# Patient Record
Sex: Male | Born: 1980 | Race: Black or African American | Hispanic: No | Marital: Single | Smoking: Never smoker
Health system: Southern US, Community
[De-identification: ages and names within clinical notes are randomized; demographics above are authoritative.]

## PROBLEM LIST (undated history)

## (undated) DIAGNOSIS — D696 Thrombocytopenia, unspecified: Principal | ICD-10-CM

## (undated) HISTORY — DX: Thrombocytopenia, unspecified: D69.6

---

## 2003-12-13 ENCOUNTER — Emergency Department (HOSPITAL_COMMUNITY): Admission: EM | Admit: 2003-12-13 | Discharge: 2003-12-14 | Payer: Self-pay | Admitting: Emergency Medicine

## 2005-01-04 ENCOUNTER — Emergency Department (HOSPITAL_COMMUNITY): Admission: EM | Admit: 2005-01-04 | Discharge: 2005-01-04 | Payer: Self-pay | Admitting: Emergency Medicine

## 2006-11-11 ENCOUNTER — Emergency Department (HOSPITAL_COMMUNITY): Admission: EM | Admit: 2006-11-11 | Discharge: 2006-11-12 | Payer: Self-pay | Admitting: Emergency Medicine

## 2007-12-02 ENCOUNTER — Emergency Department (HOSPITAL_COMMUNITY): Admission: EM | Admit: 2007-12-02 | Discharge: 2007-12-02 | Payer: Self-pay | Admitting: Family Medicine

## 2007-12-29 ENCOUNTER — Emergency Department (HOSPITAL_COMMUNITY): Admission: EM | Admit: 2007-12-29 | Discharge: 2007-12-29 | Payer: Self-pay | Admitting: Family Medicine

## 2008-01-26 IMAGING — CT CT EXTREM LOW W/O CM*R*
3 series · 16 of 33 positions shown, 19 images · IV contrast (agent unspecified)
Comparison: Plain radiographs 11/11/06.

CLINICAL DATA: Injury.  Dislocation.  
 CT OF THE RIGHT ANKLE WITHOUT CONTRAST:
TECHNIQUE: Multidetector CT imaging was performed according to the standard protocol.  No intravenous contrast was administered.  Multiplanar CT image reconstructions were also generated.

[Series 3: lowextremity 2.0 b60s · axial · 0.32mm/px · z∈[-1264,-1076]mm · 8 of 112 slices shown, 10 images]
[im 9/112  soft-tissue]
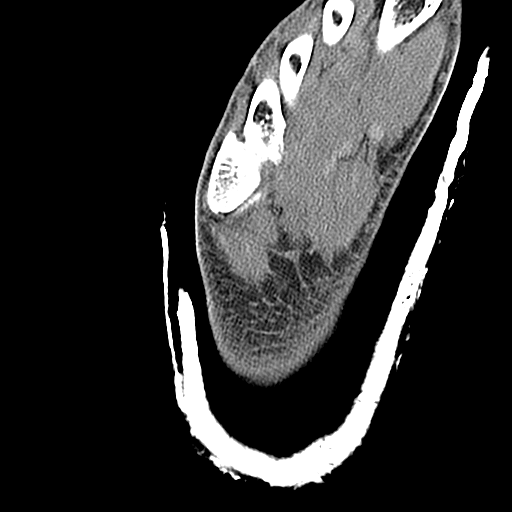
[im 9/112  bone]
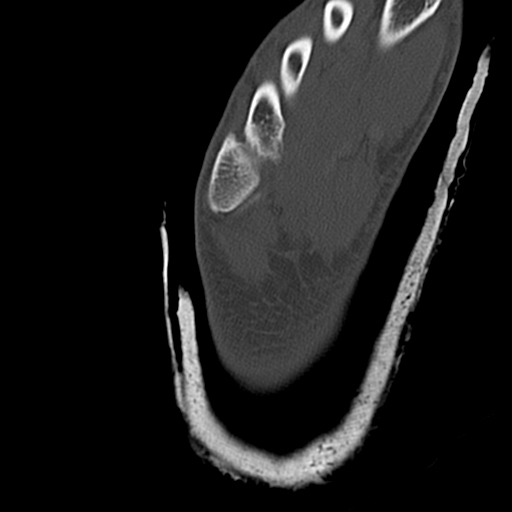
[im 26/112  bone]
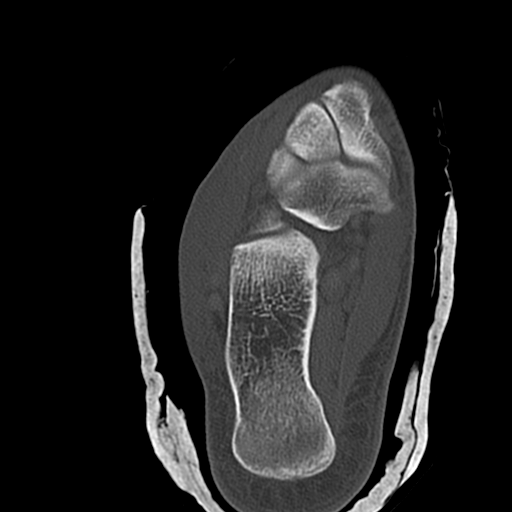
[im 35/112  bone]
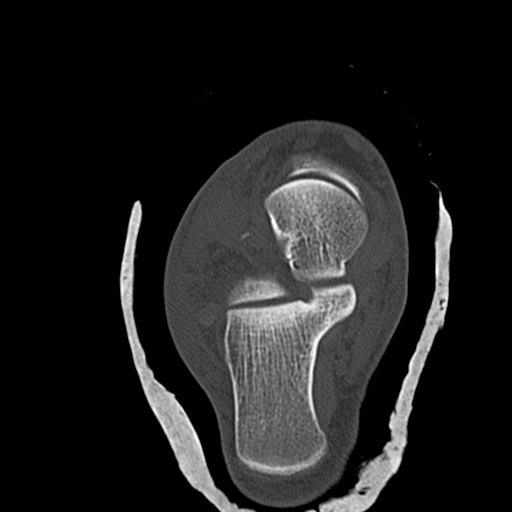
[im 52/112  bone]
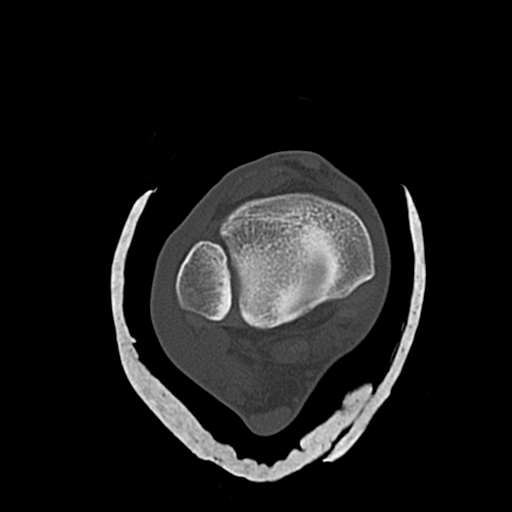
[im 60/112  soft-tissue]
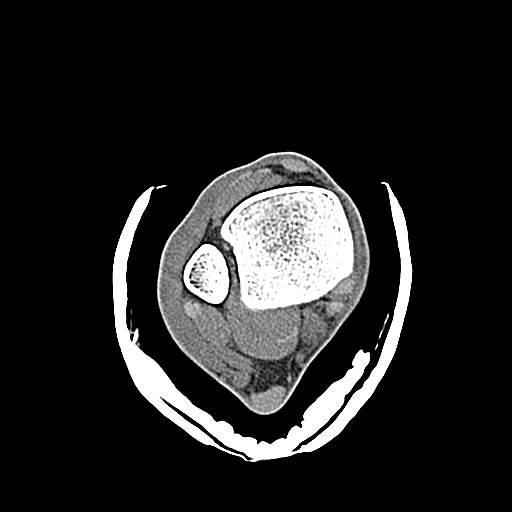
[im 60/112  bone]
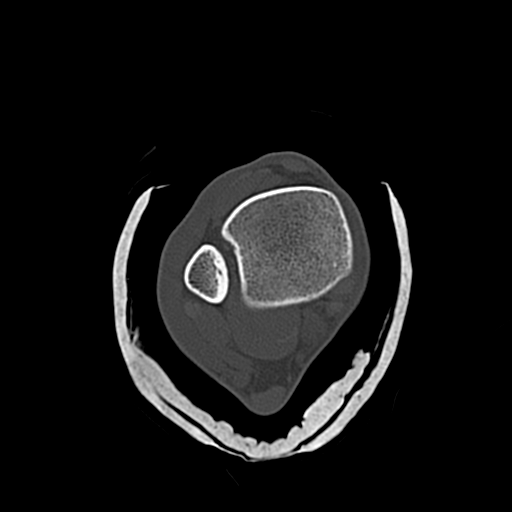
[im 77/112  bone]
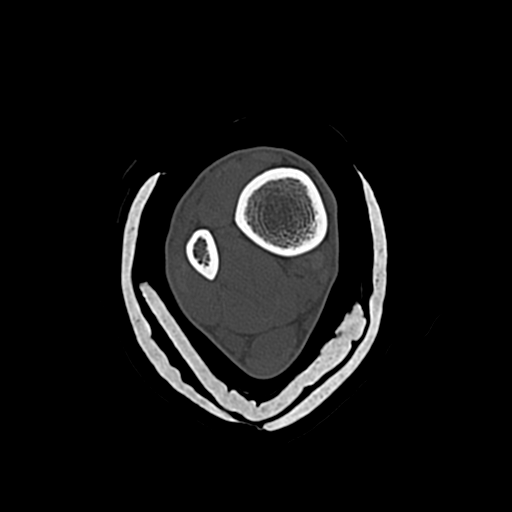
[im 86/112  bone]
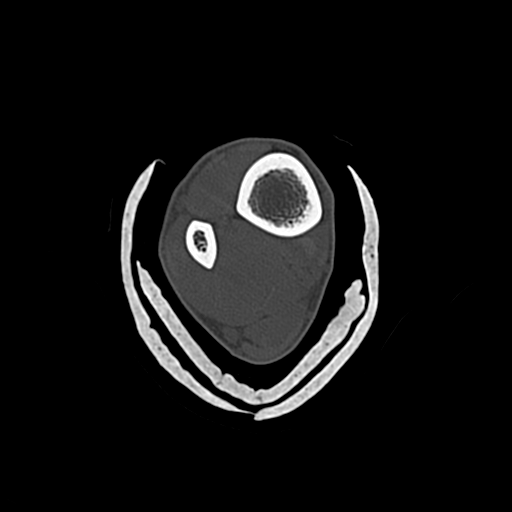
[im 103/112  bone]
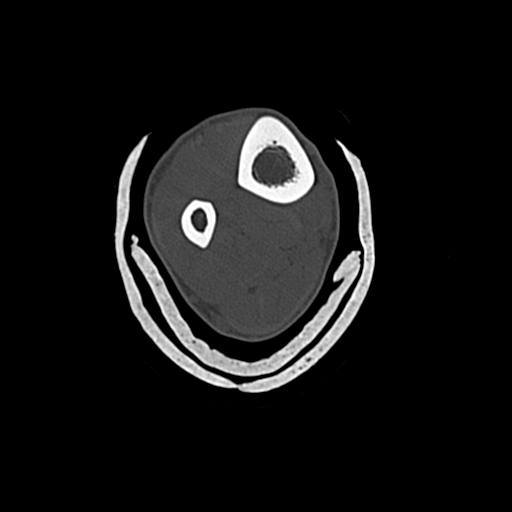

[Series 5: lowextremity 1.0 spo · coronal · 0.43mm/px · 3 of 232 slices shown (1 of 2)]
[im 47/232  bone]
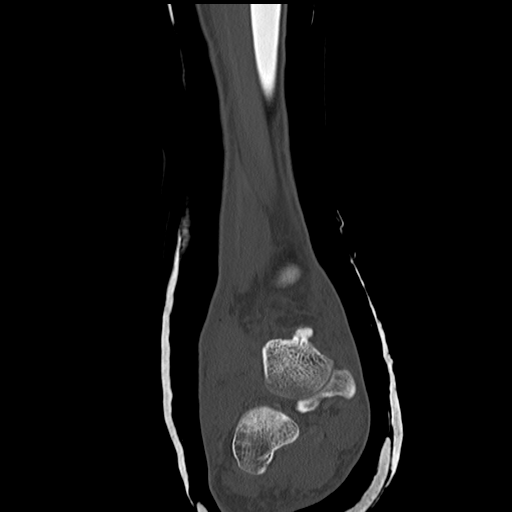
[im 93/232  bone]
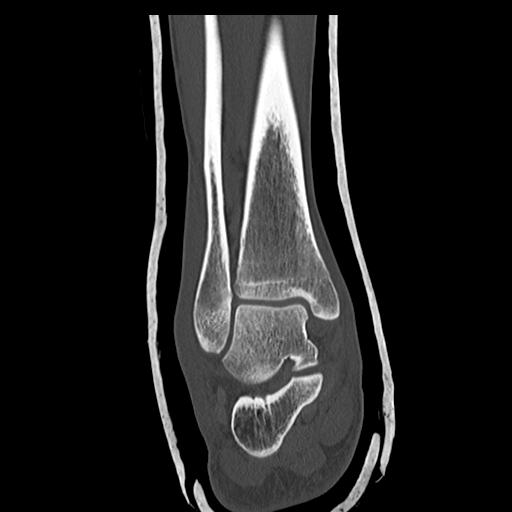
[im 139/232  bone]
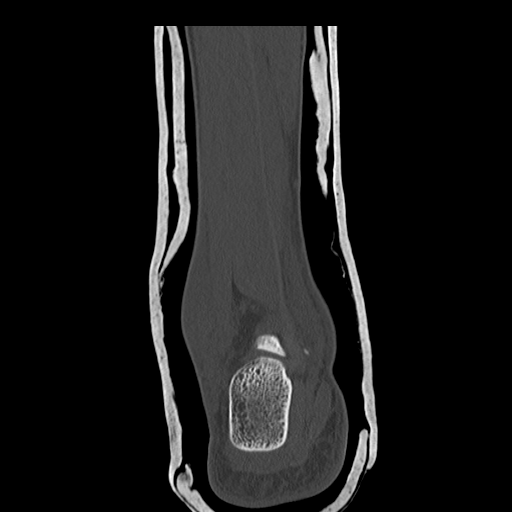

[Series 6: lowextremity 1.0 spo · sagittal · 0.74mm/px · 5 of 160 slices shown, 6 images (2 of 2)]
[im 54/160  bone]
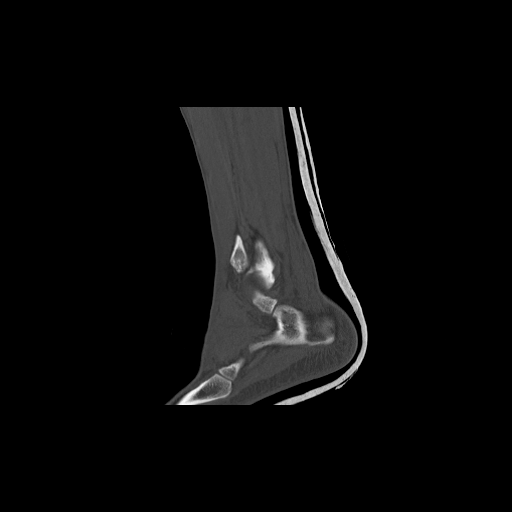
[im 67/160  bone]
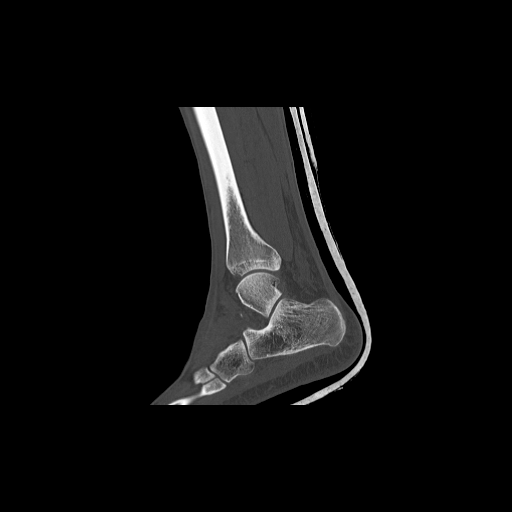
[im 80/160  soft-tissue]
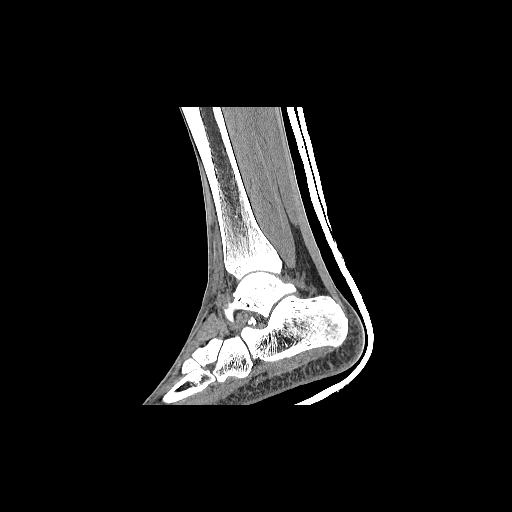
[im 80/160  bone]
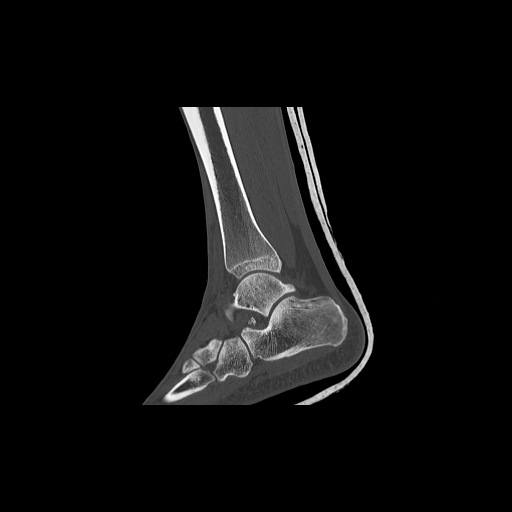
[im 93/160  bone]
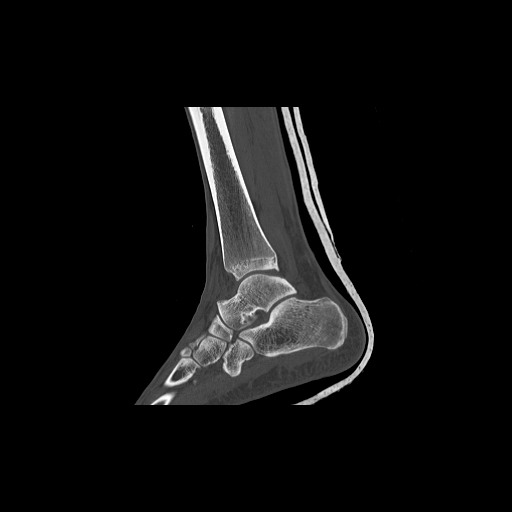
[im 107/160  bone]
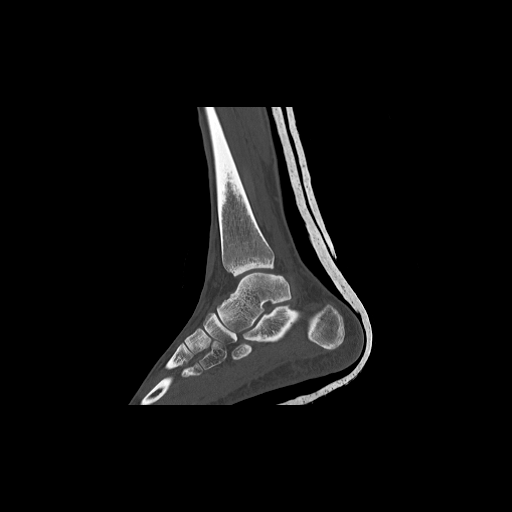

[16 of 33 positions shown; findings below may reference images not displayed]

FINDINGS: The tibiotalar and subtalar joints are located.  Imaged midfoot joints are also located.  There is marked soft tissue swelling about the ankle.  Two chip fractures are seen posteriorly off of the medial aspect of the joint likely originating from the medial talus.  Largest bony fragment is present within the sinus tarsi and likely originates from the anterior process of the calcaneus.  This fragment measures approximately 0.5 x 0.3 x 0.8 cm.  No definite tendon entrapment is identified although the flexor hallucis longus tendon passes immediately adjacent to a chip fracture off of the posterior aspect of the talus.  Tiny bony fragments are also identified at the anterior aspect of the talus slightly laterally.  Donor site is not clearly seen.  Tiny amount of gas within the subcutaneous soft tissues of the dorsum of the foot.  Ligaments are not well seen.
IMPRESSION: 1.  Successful relocation of hindfoot dislocation.
 2.  Chip fractures off of the posteromedial talus and likely off of the anterior process of the calcaneus.  
 3.  Negative for definite tendon entrapment although the FHL does pass adjacent to talus fracture raising the possibility for entrapment.

## 2009-01-29 ENCOUNTER — Emergency Department (HOSPITAL_COMMUNITY): Admission: EM | Admit: 2009-01-29 | Discharge: 2009-01-29 | Payer: Self-pay | Admitting: Family Medicine

## 2009-02-02 ENCOUNTER — Emergency Department (HOSPITAL_COMMUNITY): Admission: EM | Admit: 2009-02-02 | Discharge: 2009-02-02 | Payer: Self-pay | Admitting: Family Medicine

## 2009-05-25 ENCOUNTER — Emergency Department (HOSPITAL_COMMUNITY): Admission: EM | Admit: 2009-05-25 | Discharge: 2009-05-25 | Payer: Self-pay | Admitting: Family Medicine

## 2010-02-12 ENCOUNTER — Emergency Department (HOSPITAL_COMMUNITY): Admission: EM | Admit: 2010-02-12 | Discharge: 2010-02-12 | Payer: Self-pay | Admitting: Family Medicine

## 2010-11-30 ENCOUNTER — Emergency Department (HOSPITAL_COMMUNITY)
Admission: EM | Admit: 2010-11-30 | Discharge: 2010-11-30 | Disposition: A | Discharge status: Home / Self Care | Payer: No Typology Code available for payment source | Attending: Emergency Medicine | Admitting: Emergency Medicine

## 2010-11-30 DIAGNOSIS — S139XXA Sprain of joints and ligaments of unspecified parts of neck, initial encounter: Secondary | ICD-10-CM | POA: Insufficient documentation

## 2010-11-30 DIAGNOSIS — Y929 Unspecified place or not applicable: Secondary | ICD-10-CM | POA: Insufficient documentation

## 2010-11-30 DIAGNOSIS — R51 Headache: Secondary | ICD-10-CM | POA: Insufficient documentation

## 2010-11-30 DIAGNOSIS — M542 Cervicalgia: Secondary | ICD-10-CM | POA: Insufficient documentation

## 2011-01-07 LAB — CULTURE, ROUTINE-ABSCESS

## 2011-06-23 LAB — INFLUENZA A AND B ANTIGEN (CONVERTED LAB)

## 2011-09-15 ENCOUNTER — Encounter: Payer: Self-pay | Admitting: *Deleted

## 2011-09-15 ENCOUNTER — Emergency Department (HOSPITAL_COMMUNITY)
Admission: EM | Admit: 2011-09-15 | Discharge: 2011-09-15 | Disposition: A | Discharge status: Home / Self Care | Payer: Self-pay | Source: Home / Self Care | Attending: Emergency Medicine | Admitting: Emergency Medicine

## 2011-09-15 DIAGNOSIS — R51 Headache: Secondary | ICD-10-CM

## 2011-09-15 MED ORDER — ACETAMINOPHEN-CODEINE #3 300-30 MG PO TABS: 1.0000 | ORAL_TABLET | Freq: Four times a day (QID) | ORAL | Status: AC | PRN

## 2011-09-15 NOTE — ED Provider Notes (Signed)
History     CSN: [id number] Arrival date & time: 09/15/2011  3:25 PM   First MD Initiated Contact with Patient 09/15/11 1443      Chief Complaint  Patient presents with  . Headache    (Consider location/radiation/quality/duration/timing/severity/associated sxs/prior treatment) Patient is a 30 y.o. male presenting with headaches. The history is provided by the patient.  Headache The primary symptoms include headaches. Primary symptoms do not include loss of consciousness, altered mental status, dizziness, visual change, paresthesias, loss of sensation, speech change, memory loss, fever, nausea or vomiting. The symptoms began 12 to 24 hours ago. The symptoms are improving. The neurological symptoms are focal.  The headache is not associated with aura, photophobia, visual change, neck stiffness, paresthesias or weakness.  Additional symptoms include pain. Additional symptoms do not include neck stiffness, weakness, lower back pain, photophobia, aura, taste disturbance, tinnitus or vertigo. Medical issues do not include seizures.    History reviewed. No pertinent past medical history.  History reviewed. No pertinent past surgical history.  History reviewed. No pertinent family history.  History  Substance Use Topics  . Smoking status: Not on file  . Smokeless tobacco: Not on file  . Alcohol Use: Not on file      Review of Systems  Constitutional: Negative for fever.  HENT: Negative for neck stiffness and tinnitus.   Eyes: Negative for photophobia.  Gastrointestinal: Negative for nausea and vomiting.  Neurological: Positive for headaches. Negative for dizziness, vertigo, speech change, loss of consciousness, weakness and paresthesias.  Psychiatric/Behavioral: Negative for memory loss and altered mental status.    Allergies  Review of patient's allergies indicates no known allergies.  Home Medications   Current Outpatient Rx  Name Route Sig Dispense Refill  .  ACETAMINOPHEN-CODEINE #3 300-30 MG PO TABS Oral Take 1-2 tablets by mouth every 6 (six) hours as needed for pain. 15 tablet 0    BP 121/73  Pulse 50  Temp(Src) 98.6 F (37 C) (Oral)  Resp 14  SpO2 100%  Physical Exam  Nursing note and vitals reviewed. Constitutional: He is oriented to person, place, and time. He appears well-developed and well-nourished. No distress.  HENT:  Head: Normocephalic.  Mouth/Throat: Uvula is midline.  Eyes: Conjunctivae are normal. No scleral icterus.  Neck: Normal range of motion. No JVD present.  Cardiovascular: Normal rate and regular rhythm.   Pulmonary/Chest: Effort normal and breath sounds normal. He has no decreased breath sounds. He has no wheezes. He has no rhonchi. He has no rales.  Lymphadenopathy:    He has no cervical adenopathy.  Neurological: He is alert and oriented to person, place, and time. He has normal reflexes. No cranial nerve deficit. He exhibits normal muscle tone. Coordination normal.  Skin: Skin is warm. He is not diaphoretic.    ED Course  Procedures (including critical care time)  Labs Reviewed - No data to display No results found.   1. Headache       MDM  Headache- No further neurological symptoms-recently more visual efforts- suspect refractory visual. problems        Jimmie Molly, MD 09/15/11 [phone]

## 2011-09-15 NOTE — ED Notes (Signed)
Pt  Reports    l  Sided  Headache     Symptoms  Started  Today  denys  Any  Other      Issues  Such  As  Vomiting  /  Nausea     Or  Photophobia        Is  Awake   And  Alert    He  Was  Actually texting on phone  When  Arrived

## 2011-12-23 ENCOUNTER — Ambulatory Visit (INDEPENDENT_AMBULATORY_CARE_PROVIDER_SITE_OTHER): Payer: 59 | Admitting: Physician Assistant

## 2011-12-23 VITALS — BP 134/72 | HR 48 | Temp 98.9°F | Resp 16 | Ht 67.0 in | Wt 119.2 lb

## 2011-12-23 DIAGNOSIS — H00016 Hordeolum externum left eye, unspecified eyelid: Secondary | ICD-10-CM

## 2011-12-23 DIAGNOSIS — H00019 Hordeolum externum unspecified eye, unspecified eyelid: Secondary | ICD-10-CM

## 2011-12-23 MED ORDER — CEPHALEXIN 500 MG PO CAPS: 500.0000 mg | ORAL_CAPSULE | Freq: Three times a day (TID) | ORAL | Status: AC

## 2011-12-23 NOTE — Progress Notes (Signed)
  Subjective:    Patient ID: Jeremy Cervantes, male    DOB: July 31, 1981, 31 y.o.   MRN: [id number]  HPI 2 day h/o L eye feeling aggravated and swollen.  Painful.  NKI.  No FB. No drainage.  Review of Systems  All other systems reviewed and are negative.        Objective:   Physical Exam  Constitutional: He appears well-developed and well-nourished.  HENT:  Head: Normocephalic and atraumatic.  Eyes: Conjunctivae and EOM are normal. Pupils are equal, round, and reactive to light. Right eye exhibits no discharge. Left eye exhibits no discharge.       L upper lid w/ mild edema and central hordeolum.   Neck: Normal range of motion. Neck supple.  Cardiovascular: Normal rate, regular rhythm and normal heart sounds.        Physiologic bradycardia  Pulmonary/Chest: Effort normal and breath sounds normal.          Assessment & Plan:  Hordeolum

## 2011-12-23 NOTE — Patient Instructions (Signed)
See stye h/o that was given.  OOW X 2 days.

## 2013-12-16 ENCOUNTER — Other Ambulatory Visit: Payer: Self-pay | Admitting: Occupational Medicine

## 2013-12-16 ENCOUNTER — Ambulatory Visit: Payer: Self-pay

## 2013-12-16 DIAGNOSIS — R52 Pain, unspecified: Secondary | ICD-10-CM

## 2014-07-14 ENCOUNTER — Telehealth: Payer: Self-pay | Admitting: Hematology

## 2014-07-14 NOTE — Telephone Encounter (Signed)
S/W PATIENT AND GAVE NP APPT FOR 11/10 @ W/DR. SEH[facility] REFERRING DR. Donette LarryHUSAIN DX- LOW PLTS BETA TUBLIN

## 2014-07-21 ENCOUNTER — Telehealth: Payer: Self-pay

## 2014-07-21 NOTE — Telephone Encounter (Signed)
Delivered chart-07/21/14 °

## 2014-08-04 ENCOUNTER — Other Ambulatory Visit: Payer: Self-pay | Admitting: *Deleted

## 2014-08-07 ENCOUNTER — Telehealth: Payer: Self-pay | Admitting: Hematology and Oncology

## 2014-08-07 NOTE — Telephone Encounter (Signed)
, °

## 2014-08-08 ENCOUNTER — Ambulatory Visit: Payer: Self-pay

## 2014-08-17 ENCOUNTER — Ambulatory Visit: Payer: Self-pay

## 2014-08-17 ENCOUNTER — Ambulatory Visit: Payer: Self-pay | Admitting: Hematology and Oncology

## 2014-08-18 ENCOUNTER — Encounter: Payer: Self-pay | Admitting: Hematology and Oncology

## 2014-08-18 ENCOUNTER — Ambulatory Visit: Payer: 59

## 2014-08-18 ENCOUNTER — Telehealth: Payer: Self-pay | Admitting: Hematology and Oncology

## 2014-08-18 ENCOUNTER — Ambulatory Visit (HOSPITAL_BASED_OUTPATIENT_CLINIC_OR_DEPARTMENT_OTHER): Payer: 59

## 2014-08-18 ENCOUNTER — Ambulatory Visit (HOSPITAL_BASED_OUTPATIENT_CLINIC_OR_DEPARTMENT_OTHER): Payer: 59 | Admitting: Hematology and Oncology

## 2014-08-18 VITALS — BP 114/54 | HR 57 | Temp 97.7°F | Resp 18 | Ht 66.0 in | Wt 121.8 lb

## 2014-08-18 DIAGNOSIS — D696 Thrombocytopenia, unspecified: Secondary | ICD-10-CM

## 2014-08-18 HISTORY — DX: Thrombocytopenia, unspecified: D69.6

## 2014-08-18 LAB — CBC WITH DIFFERENTIAL/PLATELET
BASO%: 0.2 % (ref 0.0–2.0)
BASOS ABS: 0 10*3/uL (ref 0.0–0.1)
EOS ABS: 0 10*3/uL (ref 0.0–0.5)
EOS%: 0.8 % (ref 0.0–7.0)
HCT: 42.1 % (ref 38.4–49.9)
HGB: 13.8 g/dL (ref 13.0–17.1)
LYMPH#: 1.5 10*3/uL (ref 0.9–3.3)
LYMPH%: 27.4 % (ref 14.0–49.0)
MCH: 30.4 pg (ref 27.2–33.4)
MCHC: 32.8 g/dL (ref 32.0–36.0)
MCV: 92.7 fL (ref 79.3–98.0)
MONO#: 0.4 10*3/uL (ref 0.1–0.9)
MONO%: 8.1 % (ref 0.0–14.0)
NEUT%: 63.5 % (ref 39.0–75.0)
NEUTROS ABS: 3.4 10*3/uL (ref 1.5–6.5)
RBC: 4.54 10*6/uL (ref 4.20–5.82)
RDW: 12.5 % (ref 11.0–14.6)
WBC: 5.3 10*3/uL (ref 4.0–10.3)

## 2014-08-18 LAB — MORPHOLOGY
PLT EST: DECREASED
RBC Comments: NORMAL

## 2014-08-18 NOTE — Progress Notes (Signed)
Checked in new pt with no financial concerns at this time.  Pt is here for a hematology concern so financial assistance may not be needed but he has my card for any questions or concerns.

## 2014-08-18 NOTE — Telephone Encounter (Signed)
Gave avs. No return. °

## 2014-08-18 NOTE — Telephone Encounter (Signed)
Gave avs & cal for May 2016. °

## 2014-08-19 LAB — SEDIMENTATION RATE: SED RATE: 1 mm/h (ref 0–16)

## 2014-08-19 NOTE — Progress Notes (Signed)
[facility] CONSULT NOTE  Patient Care Team: Georgann HousekeeperKarrar [name], MD as PCP - General (Internal Medicine) Georgann HousekeeperKarrar [name], MD as Consulting Physician (Internal Medicine) Aasim Nelva BushShaheen Sehbai, MD as Consulting Physician (Hematology)  CHIEF COMPLAINTS/PURPOSE OF CONSULTATION:  thrombocytopenia  HISTORY OF PRESENTING ILLNESS:  Jeremy Cervantes 33 y.o. male is here because of thrombocytopenia.  He was found to have abnormal CBC from routine blood draw from PCP. Test drawn in October 2015 was low at 104. He denies recent bruising/bleeding, such as spontaneous epistaxis, hematuria, melena or hematochezia The patient denies history of liver disease, exposure to heparin, history of cardiac murmur/prior cardiovascular surgery or recent new medications He denies prior blood or platelet transfusions   MEDICAL HISTORY:  Past Medical History  Diagnosis Date  . Thrombocytopenia 08/18/2014    SURGICAL HISTORY: History reviewed. No pertinent past surgical history.  SOCIAL HISTORY: History   Social History  . Marital Status: Single    Spouse Name: N/A    Number of Children: N/A  . Years of Education: N/A   Occupational History  . Not on file.   Social History Main Topics  . Smoking status: Never Smoker   . Smokeless tobacco: Never Used  . Alcohol Use: No  . Drug Use: No  . Sexual Activity: Not on file   Other Topics Concern  . Not on file   Social History Narrative    FAMILY HISTORY: Family History  Problem Relation Age of Onset  . Cancer Maternal Grandfather     colon ca    ALLERGIES:  has No Known Allergies.  MEDICATIONS:  Current Outpatient Prescriptions  Medication Sig Dispense Refill  . doxycycline (VIBRA-TABS) 100 MG tablet   0  . valACYclovir (VALTREX) 500 MG tablet   3   No current facility-administered medications for this visit.    REVIEW OF SYSTEMS:   Constitutional: Denies fevers, chills or abnormal night sweats Eyes: Denies blurriness of  vision, double vision or watery eyes Ears, nose, mouth, throat, and face: Denies mucositis or sore throat Respiratory: Denies cough, dyspnea or wheezes Cardiovascular: Denies palpitation, chest discomfort or lower extremity swelling Gastrointestinal:  Denies nausea, heartburn or change in bowel habits Skin: Denies abnormal skin rashes Lymphatics: Denies new lymphadenopathy or easy bruising Neurological:Denies numbness, tingling or new weaknesses Behavioral/Psych: Mood is stable, no new changes  All other systems were reviewed with the patient and are negative.  PHYSICAL EXAMINATION: ECOG PERFORMANCE STATUS: 0 - Asymptomatic  Filed Vitals:   08/18/14 1035  BP: 114/54  Pulse: 57  Temp: 97.7 F (36.5 C)  Resp: 18   Filed Weights   08/18/14 1035  Weight: 121 lb 12.8 oz (55.248 kg)    GENERAL:alert, no distress and comfortable SKIN: skin color, texture, turgor are normal, no rashes or significant lesions EYES: normal, conjunctiva are pink and non-injected, sclera clear OROPHARYNX:no exudate, no erythema and lips, buccal mucosa, and tongue normal  NECK: supple, thyroid normal size, non-tender, without nodularity LYMPH:  no palpable lymphadenopathy in the cervical, axillary or inguinal LUNGS: clear to auscultation and percussion with normal breathing effort HEART: regular rate & rhythm and no murmurs and no lower extremity edema ABDOMEN:abdomen soft, non-tender and normal bowel sounds Musculoskeletal:no cyanosis of digits and no clubbing  PSYCH: alert & oriented x 3 with fluent speech NEURO: no focal motor/sensory deficits  LABORATORY DATA:  I have reviewed the data as listed Recent Results (from the past 2160 hour(s))  CBC with Differential     Status: Abnormal  Collection Time: 08/18/14 11:30 AM  Result Value Ref Range   WBC 5.3 4.0 - 10.3 10e3/uL   NEUT# 3.4 1.5 - 6.5 10e3/uL   HGB 13.8 13.0 - 17.1 g/dL   HCT 11.942.1 14.738.4 - 82.949.9 %   Platelets 108 Platelet count consistent  in citrate (L) 140 - 400 10e3/uL   MCV 92.7 79.3 - 98.0 fL   MCH 30.4 27.2 - 33.4 pg   MCHC 32.8 32.0 - 36.0 g/dL   RBC 5.624.54 1.304.20 - 8.655.82 10e6/uL   RDW 12.5 11.0 - 14.6 %   lymph# 1.5 0.9 - 3.3 10e3/uL   MONO# 0.4 0.1 - 0.9 10e3/uL   Eosinophils Absolute 0.0 0.0 - 0.5 10e3/uL   Basophils Absolute 0.0 0.0 - 0.1 10e3/uL   NEUT% 63.5 39.0 - 75.0 %   LYMPH% 27.4 14.0 - 49.0 %   MONO% 8.1 0.0 - 14.0 %   EOS% 0.8 0.0 - 7.0 %   BASO% 0.2 0.0 - 2.0 %  Morphology     Status: None   Collection Time: 08/18/14 11:30 AM  Result Value Ref Range   RBC Comments Within Normal Limits Within Normal Limits   White Cell Comments Rare Variant Lymph    PLT EST Decreased Adequate   Platelet Morphology Large Platelets Within Normal Limits  Sedimentation rate     Status: None   Collection Time: 08/18/14 11:31 AM  Result Value Ref Range   Sed Rate 1 0 - 16 mm/hr   ASSESSMENT & PLAN 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 Will order additional work-up in his next visit.

## 2014-08-19 NOTE — Assessment & Plan Note (Signed)
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 Will order additional work-up in his next visit.

## 2015-01-19 ENCOUNTER — Encounter (HOSPITAL_COMMUNITY): Payer: Self-pay | Admitting: Emergency Medicine

## 2015-01-19 ENCOUNTER — Emergency Department (INDEPENDENT_AMBULATORY_CARE_PROVIDER_SITE_OTHER)
Admission: EM | Admit: 2015-01-19 | Discharge: 2015-01-19 | Disposition: A | Discharge status: Home / Self Care | Payer: Self-pay | Source: Home / Self Care | Attending: Family Medicine | Admitting: Family Medicine

## 2015-01-19 DIAGNOSIS — G43109 Migraine with aura, not intractable, without status migrainosus: Secondary | ICD-10-CM

## 2015-01-19 MED ORDER — DICLOFENAC SODIUM 75 MG PO TBEC: 75.0000 mg | DELAYED_RELEASE_TABLET | Freq: Two times a day (BID) | ORAL | Status: DC | PRN

## 2015-01-19 NOTE — Discharge Instructions (Signed)
Thank you for coming in today. If you get worse go to the ER.  Go to the emergency room if your headache becomes excruciating or you have weakness or numbness or uncontrolled vomiting.    Migraine Headache A migraine headache is an intense, throbbing pain on one or both sides of your head. A migraine can last for 30 minutes to several hours. CAUSES  The exact cause of a migraine headache is not always known. However, a migraine may be caused when nerves in the brain become irritated and release chemicals that cause inflammation. This causes pain. Certain things may also trigger migraines, such as:  Alcohol.  Smoking.  Stress.  Menstruation.  Aged cheeses.  Foods or drinks that contain nitrates, glutamate, aspartame, or tyramine.  Lack of sleep.  Chocolate.  Caffeine.  Hunger.  Physical exertion.  Fatigue.  Medicines used to treat chest pain (nitroglycerine), birth control pills, estrogen, and some blood pressure medicines. SIGNS AND SYMPTOMS  Pain on one or both sides of your head.  Pulsating or throbbing pain.  Severe pain that prevents daily activities.  Pain that is aggravated by any physical activity.  Nausea, vomiting, or both.  Dizziness.  Pain with exposure to bright lights, loud noises, or activity.  General sensitivity to bright lights, loud noises, or smells. Before you get a migraine, you may get warning signs that a migraine is coming (aura). An aura may include:  Seeing flashing lights.  Seeing bright spots, halos, or zigzag lines.  Having tunnel vision or blurred vision.  Having feelings of numbness or tingling.  Having trouble talking.  Having muscle weakness. DIAGNOSIS  A migraine headache is often diagnosed based on:  Symptoms.  Physical exam.  A CT scan or MRI of your head. These imaging tests cannot diagnose migraines, but they can help rule out other causes of headaches. TREATMENT Medicines may be given for pain and  nausea. Medicines can also be given to help prevent recurrent migraines.  HOME CARE INSTRUCTIONS  Only take over-the-counter or prescription medicines for pain or discomfort as directed by your health care provider. The use of long-term narcotics is not recommended.  Lie down in a dark, quiet room when you have a migraine.  Keep a journal to find out what may trigger your migraine headaches. For example, write down:  What you eat and drink.  How much sleep you get.  Any change to your diet or medicines.  Limit alcohol consumption.  Quit smoking if you smoke.  Get 7-9 hours of sleep, or as recommended by your health care provider.  Limit stress.  Keep lights dim if bright lights bother you and make your migraines worse. SEEK IMMEDIATE MEDICAL CARE IF:   Your migraine becomes severe.  You have a fever.  You have a stiff neck.  You have vision loss.  You have muscular weakness or loss of muscle control.  You start losing your balance or have trouble walking.  You feel faint or pass out.  You have severe symptoms that are different from your first symptoms. MAKE SURE YOU:   Understand these instructions.  Will watch your condition.  Will get help right away if you are not doing well or get worse. Document Released: 09/15/2005 Document Revised: 01/30/2014 Document Reviewed: 05/23/2013 [facility] Patient Information 2015 [place], [place]. This information is not intended to replace advice given to you by your health care provider. Make sure you discuss any questions you have with your health care provider.  Concussion A  concussion, or closed-head injury, is a brain injury caused by a direct blow to the head or by a quick and sudden movement (jolt) of the head or neck. Concussions are usually not life-threatening. Even so, the effects of a concussion can be serious. If you have had a concussion before, you are more likely to experience concussion-like symptoms after a direct  blow to the head.  CAUSES  Direct blow to the head, such as from running into another player during a soccer game, being hit in a fight, or hitting your head on a hard surface.  A jolt of the head or neck that causes the brain to move back and forth inside the skull, such as in a car crash. SIGNS AND SYMPTOMS The signs of a concussion can be hard to notice. Early on, they may be missed by you, family members, and health care providers. You may look fine but act or feel differently. Symptoms are usually temporary, but they may last for days, weeks, or even longer. Some symptoms may appear right away while others may not show up for hours or days. Every head injury is different. Symptoms include:  Mild to moderate headaches that will not go away.  A feeling of pressure inside your head.  Having more trouble than usual:  Learning or remembering things you have heard.  Answering questions.  Paying attention or concentrating.  Organizing daily tasks.  Making decisions and solving problems.  Slowness in thinking, acting or reacting, speaking, or reading.  Getting lost or being easily confused.  Feeling tired all the time or lacking energy (fatigued).  Feeling drowsy.  Sleep disturbances.  Sleeping more than usual.  Sleeping less than usual.  Trouble falling asleep.  Trouble sleeping (insomnia).  Loss of balance or feeling lightheaded or dizzy.  Nausea or vomiting.  Numbness or tingling.  Increased sensitivity to:  Sounds.  Lights.  Distractions.  Vision problems or eyes that tire easily.  Diminished sense of taste or smell.  Ringing in the ears.  Mood changes such as feeling sad or anxious.  Becoming easily irritated or angry for little or no reason.  Lack of motivation.  Seeing or hearing things other people do not see or hear (hallucinations). DIAGNOSIS Your health care provider can usually diagnose a concussion based on a description of your injury  and symptoms. He or she will ask whether you passed out (lost consciousness) and whether you are having trouble remembering events that happened right before and during your injury. Your evaluation might include:  A brain scan to look for signs of injury to the brain. Even if the test shows no injury, you may still have a concussion.  Blood tests to be sure other problems are not present. TREATMENT  Concussions are usually treated in an emergency department, in urgent care, or at a clinic. You may need to stay in the hospital overnight for further treatment.  Tell your health care provider if you are taking any medicines, including prescription medicines, over-the-counter medicines, and natural remedies. Some medicines, such as blood thinners (anticoagulants) and aspirin, may increase the chance of complications. Also tell your health care provider whether you have had alcohol or are taking illegal drugs. This information may affect treatment.  Your health care provider will send you home with important instructions to follow.  How fast you will recover from a concussion depends on many factors. These factors include how severe your concussion is, what part of your brain was injured, your age,  and how healthy you were before the concussion.  Most people with mild injuries recover fully. Recovery can take time. In general, recovery is slower in older persons. Also, persons who have had a concussion in the past or have other medical problems may find that it takes longer to recover from their current injury. HOME CARE INSTRUCTIONS General Instructions  Carefully follow the directions your health care provider gave you.  Only take over-the-counter or prescription medicines for pain, discomfort, or fever as directed by your health care provider.  Take only those medicines that your health care provider has approved.  Do not drink alcohol until your health care provider says you are well enough  to do so. Alcohol and certain other drugs may slow your recovery and can put you at risk of further injury.  If it is harder than usual to remember things, write them down.  If you are easily distracted, try to do one thing at a time. For example, do not try to watch TV while fixing dinner.  Talk with family members or close friends when making important decisions.  Keep all follow-up appointments. Repeated evaluation of your symptoms is recommended for your recovery.  Watch your symptoms and tell others to do the same. Complications sometimes occur after a concussion. Older adults with a brain injury may have a higher risk of serious complications, such as a blood clot on the brain.  Tell your teachers, school nurse, school counselor, coach, athletic trainer, or work Production designer, theatre/television/film about your injury, symptoms, and restrictions. Tell them about what you can or cannot do. They should watch for:  Increased problems with attention or concentration.  Increased difficulty remembering or learning new information.  Increased time needed to complete tasks or assignments.  Increased irritability or decreased ability to cope with stress.  Increased symptoms.  Rest. Rest helps the brain to heal. Make sure you:  Get plenty of sleep at night. Avoid staying up late at night.  Keep the same bedtime hours on weekends and weekdays.  Rest during the day. Take daytime naps or rest breaks when you feel tired.  Limit activities that require a lot of thought or concentration. These include:  Doing homework or job-related work.  Watching TV.  Working on the computer.  Avoid any situation where there is potential for another head injury (football, hockey, soccer, basketball, martial arts, downhill snow sports and horseback riding). Your condition will get worse every time you experience a concussion. You should avoid these activities until you are evaluated by the appropriate follow-up health care  providers. Returning To Your Regular Activities You will need to return to your normal activities slowly, not all at once. You must give your body and brain enough time for recovery.  Do not return to sports or other athletic activities until your health care provider tells you it is safe to do so.  Ask your health care provider when you can drive, ride a bicycle, or operate heavy machinery. Your ability to react may be slower after a brain injury. Never do these activities if you are dizzy.  Ask your health care provider about when you can return to work or school. Preventing Another Concussion It is very important to avoid another brain injury, especially before you have recovered. In rare cases, another injury can lead to permanent brain damage, brain swelling, or death. The risk of this is greatest during the first 7-10 days after a head injury. Avoid injuries by:  Wearing a seat belt  when riding in a car.  Drinking alcohol only in moderation.  Wearing a helmet when biking, skiing, skateboarding, skating, or doing similar activities.  Avoiding activities that could lead to a second concussion, such as contact or recreational sports, until your health care provider says it is okay.  Taking safety measures in your home.  Remove clutter and tripping hazards from floors and stairways.  Use grab bars in bathrooms and handrails by stairs.  Place non-slip mats on floors and in bathtubs.  Improve lighting in dim areas. SEEK MEDICAL CARE IF:  You have increased problems paying attention or concentrating.  You have increased difficulty remembering or learning new information.  You need more time to complete tasks or assignments than before.  You have increased irritability or decreased ability to cope with stress.  You have more symptoms than before. Seek medical care if you have any of the following symptoms for more than 2 weeks after your injury:  Lasting (chronic)  headaches.  Dizziness or balance problems.  Nausea.  Vision problems.  Increased sensitivity to noise or light.  Depression or mood swings.  Anxiety or irritability.  Memory problems.  Difficulty concentrating or paying attention.  Sleep problems.  Feeling tired all the time. SEEK IMMEDIATE MEDICAL CARE IF:  You have severe or worsening headaches. These may be a sign of a blood clot in the brain.  You have weakness (even if only in one hand, leg, or part of the face).  You have numbness.  You have decreased coordination.  You vomit repeatedly.  You have increased sleepiness.  One pupil is larger than the other.  You have convulsions.  You have slurred speech.  You have increased confusion. This may be a sign of a blood clot in the brain.  You have increased restlessness, agitation, or irritability.  You are unable to recognize people or places.  You have neck pain.  It is difficult to wake you up.  You have unusual behavior changes.  You lose consciousness. MAKE SURE YOU:  Understand these instructions.  Will watch your condition.  Will get help right away if you are not doing well or get worse. Document Released: 12/06/2003 Document Revised: 09/20/2013 Document Reviewed: 04/07/2013 [facility] Patient Information 2015 [place], [place]. This information is not intended to replace advice given to you by your health care provider. Make sure you discuss any questions you have with your health care provider.
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
## 2015-01-30 ENCOUNTER — Emergency Department (INDEPENDENT_AMBULATORY_CARE_PROVIDER_SITE_OTHER)
Admission: EM | Admit: 2015-01-30 | Discharge: 2015-01-30 | Disposition: A | Discharge status: Home / Self Care | Payer: Self-pay | Source: Home / Self Care | Attending: Family Medicine | Admitting: Family Medicine

## 2015-01-30 ENCOUNTER — Encounter (HOSPITAL_COMMUNITY): Payer: Self-pay | Admitting: Emergency Medicine

## 2015-01-30 DIAGNOSIS — Z8669 Personal history of other diseases of the nervous system and sense organs: Secondary | ICD-10-CM

## 2015-01-30 DIAGNOSIS — R51 Headache: Secondary | ICD-10-CM

## 2015-01-30 DIAGNOSIS — R519 Headache, unspecified: Secondary | ICD-10-CM
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
## 2015-02-16 ENCOUNTER — Telehealth: Payer: Self-pay | Admitting: Hematology and Oncology

## 2015-02-16 ENCOUNTER — Other Ambulatory Visit: Payer: Self-pay | Admitting: Hematology and Oncology

## 2015-02-16 ENCOUNTER — Encounter: Payer: Self-pay | Admitting: Hematology and Oncology

## 2015-02-16 ENCOUNTER — Other Ambulatory Visit (HOSPITAL_BASED_OUTPATIENT_CLINIC_OR_DEPARTMENT_OTHER): Payer: 59

## 2015-02-16 ENCOUNTER — Ambulatory Visit (HOSPITAL_BASED_OUTPATIENT_CLINIC_OR_DEPARTMENT_OTHER): Payer: 59 | Admitting: Hematology and Oncology

## 2015-02-16 VITALS — BP 108/61 | HR 62 | Temp 98.4°F | Resp 18 | Ht 66.0 in | Wt 123.5 lb

## 2015-02-16 DIAGNOSIS — D696 Thrombocytopenia, unspecified: Secondary | ICD-10-CM

## 2015-02-16 DIAGNOSIS — D72819 Decreased white blood cell count, unspecified: Secondary | ICD-10-CM | POA: Diagnosis not present

## 2015-02-16 LAB — CBC WITH DIFFERENTIAL/PLATELET
BASO%: 0.5 % (ref 0.0–2.0)
Basophils Absolute: 0 10*3/uL (ref 0.0–0.1)
EOS ABS: 0.1 10*3/uL (ref 0.0–0.5)
EOS%: 2.3 % (ref 0.0–7.0)
HCT: 43.4 % (ref 38.4–49.9)
HGB: 14.6 g/dL (ref 13.0–17.1)
LYMPH%: 38.6 % (ref 14.0–49.0)
MCH: 31.3 pg (ref 27.2–33.4)
MCHC: 33.6 g/dL (ref 32.0–36.0)
MCV: 92.9 fL (ref 79.3–98.0)
MONO#: 0.4 10*3/uL (ref 0.1–0.9)
MONO%: 9.5 % (ref 0.0–14.0)
NEUT%: 49.1 % (ref 39.0–75.0)
NEUTROS ABS: 1.9 10*3/uL (ref 1.5–6.5)
PLATELETS: 121 10*3/uL — AB (ref 140–400)
RBC: 4.67 10*6/uL (ref 4.20–5.82)
RDW: 12.4 % (ref 11.0–14.6)
WBC: 3.9 10*3/uL — ABNORMAL LOW (ref 4.0–10.3)
lymph#: 1.5 10*3/uL (ref 0.9–3.3)

## 2015-02-16 LAB — COMPREHENSIVE METABOLIC PANEL (CC13)
ALK PHOS: 66 U/L (ref 40–150)
ALT: 20 U/L (ref 0–55)
ANION GAP: 11 meq/L (ref 3–11)
AST: 16 U/L (ref 5–34)
Albumin: 4.6 g/dL (ref 3.5–5.0)
BILIRUBIN TOTAL: 1.28 mg/dL — AB (ref 0.20–1.20)
BUN: 10.2 mg/dL (ref 7.0–26.0)
CO2: 27 mEq/L (ref 22–29)
CREATININE: 0.9 mg/dL (ref 0.7–1.3)
Calcium: 9.3 mg/dL (ref 8.4–10.4)
Chloride: 106 mEq/L (ref 98–109)
EGFR: >90 mL/min/{1.73_m2} (ref >90)
GLUCOSE: 92 mg/dL (ref 70–140)
Potassium: 4.6 mEq/L (ref 3.5–5.1)
SODIUM: 144 meq/L (ref 136–145)
TOTAL PROTEIN: 7.5 g/dL (ref 6.4–8.3)

## 2015-02-16 LAB — MORPHOLOGY: PLT EST: DECREASED

## 2015-02-16 LAB — HIV ANTIBODY (ROUTINE TESTING W REFLEX): HIV 1&2 Ab, 4th Generation: NONREACTIVE

## 2015-02-16 NOTE — Assessment & Plan Note (Signed)
He has mild leukopenia likely related to recent viral infection. I recommend observation only. At present time he does not need antibiotic therapy.

## 2015-02-16 NOTE — Progress Notes (Signed)
Jeremy [place], MD SUMMARY OF HEMATOLOGIC HISTORY: He was found to have abnormal CBC from routine blood draw from PCP. Test drawn in October 2015 was low at 104. He denies recent bruising/bleeding, such as spontaneous epistaxis, hematuria, melena or hematochezia The patient denies history of liver disease, exposure to heparin, history of cardiac murmur/prior cardiovascular surgery or recent new medications He denies prior blood or platelet transfusions INTERVAL HISTORY: Jeremy Cervantes 34 y.o. male returns for further follow-up. He few swell up off from recent viral illness last week. He things he has fully recovered. He denies breakout of recent herpes.  The patient denies any recent signs or symptoms of bleeding such as spontaneous epistaxis, hematuria or hematochezia.  I have reviewed the past medical history, past surgical history, social history and family history with the patient and they are unchanged from previous note.  ALLERGIES:  has No Known Allergies.  MEDICATIONS:  Current Outpatient Prescriptions  Medication Sig Dispense Refill  . valACYclovir (VALTREX) 500 MG tablet Take 500 mg by mouth as needed.   3   No current facility-administered medications for this visit.     REVIEW OF SYSTEMS:   Constitutional: Denies fevers, chills or night sweats Eyes: Denies blurriness of vision Ears, nose, mouth, throat, and face: Denies mucositis or sore throat Respiratory: Denies cough, dyspnea or wheezes Cardiovascular: Denies palpitation, chest discomfort or lower extremity swelling Gastrointestinal:  Denies nausea, heartburn or change in bowel habits Skin: Denies abnormal skin rashes Lymphatics: Denies new lymphadenopathy or easy bruising Neurological:Denies numbness, tingling or new weaknesses Behavioral/Psych: Mood is stable, no new changes  All other systems were reviewed with the patient and are negative.  PHYSICAL  EXAMINATION: ECOG PERFORMANCE STATUS: 0 - Asymptomatic  Filed Vitals:   02/16/15 0903  BP: 108/61  Pulse: 62  Temp: 98.4 F (36.9 C)  Resp: 18   Filed Weights   02/16/15 0903  Weight: 123 lb 8 oz (56.019 kg)    GENERAL:alert, no distress and comfortable SKIN: skin color, texture, turgor are normal, no rashes or significant lesions EYES: normal, Conjunctiva are pink and non-injected, sclera clear OROPHARYNX:no exudate, no erythema and lips, buccal mucosa, and tongue normal  NECK: supple, thyroid normal size, non-tender, without nodularity LYMPH:  no palpable lymphadenopathy in the cervical, axillary or inguinal LUNGS: clear to auscultation and percussion with normal breathing effort HEART: regular rate & rhythm and no murmurs and no lower extremity edema ABDOMEN:abdomen soft, non-tender and normal bowel sounds Musculoskeletal:no cyanosis of digits and no clubbing  NEURO: alert & oriented x 3 with fluent speech, no focal motor/sensory deficits  LABORATORY DATA:  I have reviewed the data as listed Results for orders placed or performed in visit on 02/16/15 (from the past 48 hour(s))  CBC with Differential     Status: Abnormal   Collection Time: 02/16/15  8:47 AM  Result Value Ref Range   WBC 3.9 (L) 4.0 - 10.3 10e3/uL   NEUT# 1.9 1.5 - 6.5 10e3/uL   HGB 14.6 13.0 - 17.1 g/dL   HCT 43.4 38.4 - 49.9 %   Platelets 121 (L) 140 - 400 10e3/uL   MCV 92.9 79.3 - 98.0 fL   MCH 31.3 27.2 - 33.4 pg   MCHC 33.6 32.0 - 36.0 g/dL   RBC 4.67 4.20 - 5.82 10e6/uL   RDW 12.4 11.0 - 14.6 %   lymph# 1.5 0.9 - 3.3 10e3/uL   MONO# 0.4 0.1 - 0.9 10e3/uL  Eosinophils Absolute 0.1 0.0 - 0.5 10e3/uL   Basophils Absolute 0.0 0.0 - 0.1 10e3/uL   NEUT% 49.1 39.0 - 75.0 %   LYMPH% 38.6 14.0 - 49.0 %   MONO% 9.5 0.0 - 14.0 %   EOS% 2.3 0.0 - 7.0 %   BASO% 0.5 0.0 - 2.0 %  Morphology     Status: None   Collection Time: 02/16/15  8:47 AM  Result Value Ref Range   Ovalocytes Few Negative   White  Cell Comments C/W auto diff    PLT EST Decreased Adequate   Platelet Morphology Large Platelets Within Normal Limits  Comprehensive metabolic panel     Status: Abnormal   Collection Time: 02/16/15  8:49 AM  Result Value Ref Range   Sodium 144 136 - 145 mEq/L   Potassium 4.6 3.5 - 5.1 mEq/L   Chloride 106 98 - 109 mEq/L   CO2 27 22 - 29 mEq/L   Glucose 92 70 - 140 mg/dl   BUN 10.2 7.0 - 26.0 mg/dL   Creatinine 0.9 0.7 - 1.3 mg/dL   Total Bilirubin 1.28 (H) 0.20 - 1.20 mg/dL   Alkaline Phosphatase 66 40 - 150 U/L   AST 16 5 - 34 U/L   ALT 20 0 - 55 U/L   Total Protein 7.5 6.4 - 8.3 g/dL   Albumin 4.6 3.5 - 5.0 g/dL   Calcium 9.3 8.4 - 10.4 mg/dL   Anion Gap 11 3 - 11 mEq/L   EGFR >90 >90 ml/min/1.73 m2    Comment: eGFR is calculated using the CKD-EPI Creatinine Equation (2009)    Lab Results  Component Value Date   WBC 3.9* 02/16/2015   HGB 14.6 02/16/2015   HCT 43.4 02/16/2015   MCV 92.9 02/16/2015   PLT 121* 02/16/2015    ASSESSMENT & PLAN:  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  I have ordered a few blood work. He is not symptomatic and can be observed. I will see him next year for further follow-up.   Leukopenia He has mild leukopenia likely related to recent viral infection. I recommend observation only. At present time he does not need antibiotic therapy.    All questions were answered. The patient knows to call the clinic with any problems, questions or concerns. No barriers to learning was detected.  I spent 15 minutes counseling the patient face to face. The total time spent in the appointment was 20 minutes and more than 50% was on counseling.     Alvy Bimler, Sirus Labrie, MD 5/20/201612:49 PM

## 2015-02-16 NOTE — Assessment & Plan Note (Signed)
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  I have ordered a few blood work. He is not symptomatic and can be observed. I will see him next year for further follow-up.

## 2015-02-16 NOTE — Telephone Encounter (Signed)
s.w. pt and advised on JUNE appt.....pt ok and aware °

## 2015-02-19 LAB — SEDIMENTATION RATE: Sed Rate: 1 mm/hr (ref 0–15)

## 2015-02-19 LAB — VITAMIN B12: Vitamin B-12: 223 pg/mL (ref 211–911)

## 2015-02-19 LAB — HEPATITIS C ANTIBODY: HCV Ab: NEGATIVE

## 2015-02-19 LAB — ANA: ANA: NEGATIVE

## 2015-02-28 ENCOUNTER — Telehealth: Payer: Self-pay | Admitting: *Deleted

## 2015-02-28 NOTE — Telephone Encounter (Signed)
Informed pt of Dr. Gorsuch's message below.  He verbalized understanding.   

## 2015-02-28 NOTE — Telephone Encounter (Signed)
-----   Message from Artis DelayNi Gorsuch, MD sent at 02/26/2015  8:48 PM EDT ----- Regarding: Test result Pls let him know most tests are OK except borderline low vitamin B12 I recommend him to take 1000 mcg daily vitamin B12

## 2015-05-29 ENCOUNTER — Telehealth: Payer: Self-pay | Admitting: Hematology and Oncology

## 2015-05-29 NOTE — Telephone Encounter (Signed)
Mailed pt medical records to [facility] physicians

## 2016-03-07 ENCOUNTER — Other Ambulatory Visit (HOSPITAL_BASED_OUTPATIENT_CLINIC_OR_DEPARTMENT_OTHER): Payer: BC Managed Care – PPO

## 2016-03-07 ENCOUNTER — Encounter: Payer: Self-pay | Admitting: Hematology and Oncology

## 2016-03-07 ENCOUNTER — Ambulatory Visit (HOSPITAL_BASED_OUTPATIENT_CLINIC_OR_DEPARTMENT_OTHER): Payer: BC Managed Care – PPO | Admitting: Hematology and Oncology

## 2016-03-07 VITALS — BP 112/77 | HR 50 | Temp 98.1°F | Resp 18 | Ht 66.0 in | Wt 119.8 lb

## 2016-03-07 DIAGNOSIS — D72819 Decreased white blood cell count, unspecified: Secondary | ICD-10-CM

## 2016-03-07 DIAGNOSIS — D696 Thrombocytopenia, unspecified: Secondary | ICD-10-CM

## 2016-03-07 LAB — CBC WITH DIFFERENTIAL/PLATELET
BASO%: 0.2 % (ref 0.0–2.0)
Basophils Absolute: 0 10*3/uL (ref 0.0–0.1)
EOS%: 1.6 % (ref 0.0–7.0)
Eosinophils Absolute: 0.1 10*3/uL (ref 0.0–0.5)
HCT: 40.4 % (ref 38.4–49.9)
HGB: 13.6 g/dL (ref 13.0–17.1)
LYMPH%: 38.1 % (ref 14.0–49.0)
MCH: 31 pg (ref 27.2–33.4)
MCHC: 33.7 g/dL (ref 32.0–36.0)
MCV: 92 fL (ref 79.3–98.0)
MONO#: 0.4 10*3/uL (ref 0.1–0.9)
MONO%: 10.2 % (ref 0.0–14.0)
NEUT%: 49.9 % (ref 39.0–75.0)
NEUTROS ABS: 2.2 10*3/uL (ref 1.5–6.5)
Platelets: 131 10*3/uL — ABNORMAL LOW (ref 140–400)
RBC: 4.39 10*6/uL (ref 4.20–5.82)
RDW: 12.4 % (ref 11.0–14.6)
WBC: 4.3 10*3/uL (ref 4.0–10.3)
lymph#: 1.7 10*3/uL (ref 0.9–3.3)

## 2016-03-07 NOTE — Assessment & Plan Note (Signed)
He has mild intermittent leukopenia likely reactive in nature from intermittent viral infection. His African-American heritage can also cause borderline leukopenia. I recommend observation only.  Previous workup including viral studies, autoimmune screen, etc. were negative.

## 2016-03-07 NOTE — Progress Notes (Signed)
[facility] OFFICE PROGRESS NOTE  [name],[name], MD SUMMARY OF HEMATOLOGIC HISTORY:  He was found to have abnormal CBC from routine blood draw from PCP. Test drawn in October 2015 was low at 104. He denies recent bruising/bleeding, such as spontaneous epistaxis, hematuria, melena or hematochezia The patient denies history of liver disease, exposure to heparin, history of cardiac murmur/prior cardiovascular surgery or recent new medications He denies prior blood or platelet transfusions Blood work from 2016 show borderline low vitamin B-12 level. He was recommend over-the-counter vitamin B-12 supplement INTERVAL HISTORY: Jeremy ReadyBrandon D Cervantes 35 y.o. male returns for further follow-up. He feels well. Denies recent infection. The patient denies any recent signs or symptoms of bleeding such as spontaneous epistaxis, hematuria or hematochezia.   I have reviewed the past medical history, past surgical history, social history and family history with the patient and they are unchanged from previous note.  ALLERGIES:  has No Known Allergies.  MEDICATIONS:  Current Outpatient Prescriptions  Medication Sig Dispense Refill  . valACYclovir (VALTREX) 500 MG tablet Take 500 mg by mouth as needed.   3   No current facility-administered medications for this visit.     REVIEW OF SYSTEMS:   Constitutional: Denies fevers, chills or night sweats Eyes: Denies blurriness of vision Ears, nose, mouth, throat, and face: Denies mucositis or sore throat Respiratory: Denies cough, dyspnea or wheezes Cardiovascular: Denies palpitation, chest discomfort or lower extremity swelling Gastrointestinal:  Denies nausea, heartburn or change in bowel habits Skin: Denies abnormal skin rashes Lymphatics: Denies new lymphadenopathy or easy bruising Neurological:Denies numbness, tingling or new weaknesses Behavioral/Psych: Mood is stable, no new changes  All other systems were reviewed with the patient and  are negative.  PHYSICAL EXAMINATION: ECOG PERFORMANCE STATUS: 0 - Asymptomatic  Filed Vitals:   03/07/16 0903  BP: 112/77  Pulse: 50  Temp: 98.1 F (36.7 C)  Resp: 18   Filed Weights   03/07/16 0903  Weight: 119 lb 12.8 oz (54.341 kg)    GENERAL:alert, no distress and comfortable SKIN: skin color, texture, turgor are normal, no rashes or significant lesions NEURO: alert & oriented x 3 with fluent speech, no focal motor/sensory deficits  LABORATORY DATA:  I have reviewed the data as listed     Component Value Date/Time   NA 144 02/16/2015 0849   K 4.6 02/16/2015 0849   CO2 27 02/16/2015 0849   GLUCOSE 92 02/16/2015 0849   BUN 10.2 02/16/2015 0849   CREATININE 0.9 02/16/2015 0849   CALCIUM 9.3 02/16/2015 0849   PROT 7.5 02/16/2015 0849   ALBUMIN 4.6 02/16/2015 0849   AST 16 02/16/2015 0849   ALT 20 02/16/2015 0849   ALKPHOS 66 02/16/2015 0849   BILITOT 1.28* 02/16/2015 0849    No results found for: SPEP, UPEP  Lab Results  Component Value Date   WBC 4.3 03/07/2016   NEUTROABS 2.2 03/07/2016   HGB 13.6 03/07/2016   HCT 40.4 03/07/2016   MCV 92.0 03/07/2016   PLT 131* 03/07/2016      Chemistry      Component Value Date/Time   NA 144 02/16/2015 0849   K 4.6 02/16/2015 0849   CO2 27 02/16/2015 0849   BUN 10.2 02/16/2015 0849   CREATININE 0.9 02/16/2015 0849      Component Value Date/Time   CALCIUM 9.3 02/16/2015 0849   ALKPHOS 66 02/16/2015 0849   AST 16 02/16/2015 0849   ALT 20 02/16/2015 0849   BILITOT 1.28* 02/16/2015 0849  ASSESSMENT & PLAN:  Thrombocytopenia (HCC) This has improved. He is not symptomatic. Previously, he was noted to have borderline vitamin B 12 deficiency. I recommend he take over-the-counter vitamin B-12 supplement indefinitely  Leukopenia He has mild intermittent leukopenia likely reactive in nature from intermittent viral infection. His African-American heritage can also cause borderline leukopenia. I recommend  observation only.  Previous workup including viral studies, autoimmune screen, etc. were negative.   All questions were answered. The patient knows to call the clinic with any problems, questions or concerns. No barriers to learning was detected.  I spent 10 minutes counseling the patient face to face. The total time spent in the appointment was 15 minutes and more than 50% was on counseling.     [facility], Randall Colden, MD 6/9/20179:57 AM

## 2016-03-07 NOTE — Assessment & Plan Note (Signed)
This has improved. He is not symptomatic. Previously, he was noted to have borderline vitamin B 12 deficiency. I recommend he take over-the-counter vitamin B-12 supplement indefinitely

## 2017-08-04 ENCOUNTER — Other Ambulatory Visit: Payer: Self-pay | Admitting: Internal Medicine

## 2017-08-04 DIAGNOSIS — R51 Headache: Principal | ICD-10-CM

## 2017-08-04 DIAGNOSIS — R519 Headache, unspecified: Secondary | ICD-10-CM

## 2017-08-05 ENCOUNTER — Ambulatory Visit
Admission: RE | Admit: 2017-08-05 | Discharge: 2017-08-05 | Disposition: A | Discharge status: Home / Self Care | Payer: BC Managed Care – PPO | Source: Ambulatory Visit | Attending: Internal Medicine | Admitting: Internal Medicine

## 2017-08-05 DIAGNOSIS — R519 Headache, unspecified: Secondary | ICD-10-CM

## 2017-08-05 DIAGNOSIS — R51 Headache: Principal | ICD-10-CM

## 2019-11-02 ENCOUNTER — Ambulatory Visit
Admission: EM | Admit: 2019-11-02 | Discharge: 2019-11-02 | Disposition: A | Discharge status: Home / Self Care | Payer: BC Managed Care – PPO | Attending: Emergency Medicine | Admitting: Emergency Medicine

## 2019-11-02 DIAGNOSIS — J029 Acute pharyngitis, unspecified: Secondary | ICD-10-CM

## 2019-11-02 DIAGNOSIS — Z20822 Contact with and (suspected) exposure to covid-19: Secondary | ICD-10-CM

## 2019-11-02 NOTE — Discharge Instructions (Signed)
Your COVID test is pending - it is important to quarantine / isolate at home until your results are back. °If you test positive and would like further evaluation for persistent or worsening symptoms, you may schedule an E-visit or virtual (video) visit throughout the [facility] My[facility] app or website. ° °PLEASE NOTE: If you develop severe chest pain or shortness of breath please go to the ER or call 9-1-1 for further evaluation --> DO NOT schedule electronic or virtual visits for this. °Please call our office for further guidance / recommendations as needed. ° °For information about the Covid vaccine, please visit [email or web address] °

## 2019-11-02 NOTE — ED Provider Notes (Signed)
[facility]    CSN: [id number] Arrival date & time: 11/02/19  1630      History   Chief Complaint Chief Complaint  Patient presents with  . Sore Throat    HPI Jeremy Cervantes is a 39 y.o. male presenting for 2-day course of sore throat.  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  Denies knowing of any other Covid positive students.  Dates throat hurts more when he swallows.  Has been using salt water, tea, NyQuil with adequate relief of symptoms.  Denies fever, cough, shortness of breath, chest pain.  No ear or sinus pain, nasal congestion.   Past Medical History:  Diagnosis Date  . Thrombocytopenia (HCC) 08/18/2014    Patient Active Problem List   Diagnosis Date Noted  . Leukopenia 02/16/2015  . Thrombocytopenia (HCC) 08/18/2014    History reviewed. No pertinent surgical history.     Home Medications    Prior to Admission medications   Medication Sig Start Date End Date Taking? Authorizing Provider  valACYclovir (VALTREX) 500 MG tablet Take 500 mg by mouth as needed.  05/16/14   [provider]    Family History Family History  Problem Relation Age of Onset  . Cancer Maternal Grandfather        colon ca    Social History Social History   Tobacco Use  . Smoking status: Never Smoker  . Smokeless tobacco: Never Used  Substance Use Topics  . Alcohol use: No  . Drug use: No     Allergies   Patient has no known allergies.   Review of Systems As per HPI   Physical Exam Triage Vital Signs ED Triage Vitals  Enc Vitals Group     BP      Pulse      Resp      Temp      Temp src      SpO2      Weight      Height      Head Circumference      Peak Flow      Pain Score      Pain Loc      Pain Edu?      Excl. in GC?    No data found.  Updated Vital Signs BP 120/78 (BP Location: Left Arm)   Pulse 67   Temp 98 F (36.7 C)  (Oral)   Resp 18   SpO2 97%   Visual Acuity Right Eye Distance:   Left Eye Distance:   Bilateral Distance:    Right Eye Near:   Left Eye Near:    Bilateral Near:     Physical Exam Constitutional:      General: He is not in acute distress.    Appearance: He is not toxic-appearing or diaphoretic.  HENT:     Head: Normocephalic and atraumatic.     Mouth/Throat:     Mouth: Mucous membranes are moist.     Pharynx: Oropharynx is clear. Uvula midline.     Tonsils: No tonsillar exudate.     Comments: Cobblestoning present Eyes:     General: No scleral icterus.    Conjunctiva/sclera: Conjunctivae normal.     Pupils: Pupils are equal, round, and reactive to light.  Neck:     Comments: Trachea midline, negative JVD Cardiovascular:     Rate and Rhythm: Normal rate.  Pulmonary:  Effort: Pulmonary effort is normal. No respiratory distress.     Breath sounds: No wheezing.  Musculoskeletal:     Cervical back: Neck supple. No tenderness.  Lymphadenopathy:     Cervical: No cervical adenopathy.  Skin:    Capillary Refill: Capillary refill takes less than 2 seconds.     Coloration: Skin is not jaundiced or pale.     Findings: No rash.  Neurological:     Mental Status: He is alert and oriented to person, place, and time.      UC Treatments / Results  Labs (all labs ordered are listed, but only abnormal results are displayed) Labs Reviewed  NOVEL CORONAVIRUS, NAA    EKG   Radiology No results found.  Procedures Procedures (including critical care time)  Medications Ordered in UC Medications - No data to display  Initial Impression / Assessment and Plan / UC Course  I have reviewed the triage vital signs and the nursing notes.  Pertinent labs & imaging results that were available during my care of the patient were reviewed by me and considered in my medical decision making (see chart for details).      Patient afebrile, nontoxic, with SpO2 97%.  Covid PCR  pending.  Patient to quarantine until results are back.  We will continue supportive management.  Return precautions discussed, patient verbalized understanding and is agreeable to plan. Final Clinical Impressions(s) / UC Diagnoses   Final diagnoses:  Exposure to COVID-19 virus  Sore throat     Discharge Instructions     Your COVID test is pending - it is important to quarantine / isolate at home until your results are back. If you test positive and would like further evaluation for persistent or worsening symptoms, you may schedule an E-visit or virtual (video) visit throughout the [facility] app or website.  PLEASE NOTE: If you develop severe chest pain or shortness of breath please go to the ER or call 9-1-1 for further evaluation --> DO NOT schedule electronic or virtual visits for this. Please call our office for further guidance / recommendations as needed.  For information about the Covid vaccine, please visit [email or web address]    ED Prescriptions    None     PDMP not reviewed this encounter.   Hall-Potvin, [place], [place] 11/03/19 1432

## 2019-11-02 NOTE — ED Triage Notes (Signed)
Pt c/o sore throat x2 days. 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

## 2019-11-04 ENCOUNTER — Telehealth: Payer: Self-pay | Admitting: Emergency Medicine

## 2019-11-04 LAB — NOVEL CORONAVIRUS, NAA: SARS-CoV-2, NAA: NOT DETECTED

## 2019-11-04 NOTE — Telephone Encounter (Signed)
Patient called to receive results for COVID test.  Verified identity using two identifiers and provided negative result.  Patient verbalized understanding.

## 2021-06-05 ENCOUNTER — Other Ambulatory Visit: Payer: Self-pay

## 2021-06-05 ENCOUNTER — Encounter: Payer: Self-pay | Admitting: Emergency Medicine

## 2021-06-05 ENCOUNTER — Ambulatory Visit
Admission: EM | Admit: 2021-06-05 | Discharge: 2021-06-05 | Disposition: A | Discharge status: Home / Self Care | Payer: BC Managed Care – PPO | Attending: Urgent Care | Admitting: Urgent Care

## 2021-06-05 DIAGNOSIS — H00021 Hordeolum internum right upper eyelid: Secondary | ICD-10-CM | POA: Diagnosis not present

## 2021-06-05 MED ORDER — CEFDINIR 300 MG PO CAPS: 300.0000 mg | ORAL_CAPSULE | Freq: Two times a day (BID) | ORAL | 0 refills | Status: AC

## 2021-06-05 NOTE — ED Triage Notes (Signed)
Pt sts stye to right upper eye lid x 1 week; pt sts did drain but then refilled

## 2021-06-05 NOTE — ED Provider Notes (Signed)
[facility]   MRN: [id number] DOB: 31-Jul-1981  Subjective:   Jeremy Cervantes is a 40 y.o. male presenting for 1 week history of persistent right upper eyelid pain and swelling.  Patient states his symptoms started out with a small stye, he was applying warm compresses and got it to burst.  He thought he would improved thereafter but has only gotten worse.  Denies vision change, eye trauma, fevers.  No current facility-administered medications for this encounter.  Current Outpatient Medications:    valACYclovir (VALTREX) 500 MG tablet, Take 500 mg by mouth as needed. , Disp: , Rfl: 3   No Known Allergies  Past Medical History:  Diagnosis Date   Thrombocytopenia (HCC) 08/18/2014     No past surgical history on file.  Family History  Problem Relation Age of Onset   Cancer Maternal Grandfather        colon ca    Social History   Tobacco Use   Smoking status: Never   Smokeless tobacco: Never  Substance Use Topics   Alcohol use: No   Drug use: No    ROS   Objective:   Vitals: BP 117/72 (BP Location: Left Arm)   Pulse (!) 58   Temp 98.6 F (37 C) (Oral)   Resp 18   SpO2 97%   Physical Exam Constitutional:      General: He is not in acute distress.    Appearance: Normal appearance. He is well-developed and normal weight. He is not ill-appearing, toxic-appearing or diaphoretic.  HENT:     Head: Normocephalic and atraumatic.     Right Ear: External ear normal.     Left Ear: External ear normal.     Nose: Nose normal.     Mouth/Throat:     Pharynx: Oropharynx is clear.  Eyes:     General: Lids are everted, no foreign bodies appreciated. Vision grossly intact. No scleral icterus.       Right eye: Hordeolum (Internal hordeolum of the upper right lateral eyelid with diffuse swelling of the right upper eyelid of 1+ and associated erythema and exquisite tenderness) present. No foreign body or discharge.        Left eye: No foreign body, discharge or  hordeolum.     Extraocular Movements: Extraocular movements intact.     Conjunctiva/sclera:     Right eye: Right conjunctiva is not injected. No chemosis, exudate or hemorrhage.    Left eye: Left conjunctiva is not injected. No chemosis, exudate or hemorrhage.    Pupils: Pupils are equal, round, and reactive to light.  Cardiovascular:     Rate and Rhythm: Normal rate.  Pulmonary:     Effort: Pulmonary effort is normal.  Musculoskeletal:     Cervical back: Normal range of motion.  Neurological:     Mental Status: He is alert and oriented to person, place, and time.  Psychiatric:        Mood and Affect: Mood normal.        Behavior: Behavior normal.        Thought Content: Thought content normal.        Judgment: Judgment normal.      Assessment and Plan :   PDMP not reviewed this encounter.  1. Hordeolum internum of right upper eyelid     Counseled on the nature and management of an internal hordeolum.  Will cover for a preseptal cellulitis as a complication of his stye with cefdinir.  Follow-up with ophthalmology for recheck and  consideration for procedural removal. Counseled patient on potential for adverse effects with medications prescribed/recommended today, ER and return-to-clinic precautions discussed, patient verbalized understanding.    Wallis Bamberg, [place] 06/05/21 1712

## 2023-03-22 ENCOUNTER — Encounter (HOSPITAL_BASED_OUTPATIENT_CLINIC_OR_DEPARTMENT_OTHER): Payer: Self-pay

## 2023-03-22 ENCOUNTER — Other Ambulatory Visit: Payer: Self-pay

## 2023-03-22 ENCOUNTER — Emergency Department (HOSPITAL_BASED_OUTPATIENT_CLINIC_OR_DEPARTMENT_OTHER)
Admission: EM | Admit: 2023-03-22 | Discharge: 2023-03-23 | Disposition: A | Discharge status: Home / Self Care | Payer: BC Managed Care – PPO | Attending: Emergency Medicine | Admitting: Emergency Medicine

## 2023-03-22 DIAGNOSIS — R519 Headache, unspecified: Secondary | ICD-10-CM | POA: Insufficient documentation

## 2023-03-22 LAB — CBC WITH DIFFERENTIAL/PLATELET
Abs Immature Granulocytes: 0.01 10*3/uL (ref 0.00–0.07)
Basophils Absolute: 0 10*3/uL (ref 0.0–0.1)
Basophils Relative: 0 %
Eosinophils Absolute: 0.1 10*3/uL (ref 0.0–0.5)
Eosinophils Relative: 1 %
HCT: 41.6 % (ref 39.0–52.0)
Hemoglobin: 14 g/dL (ref 13.0–17.0)
Immature Granulocytes: 0 %
Lymphocytes Relative: 41 %
Lymphs Abs: 3 10*3/uL (ref 0.7–4.0)
MCH: 30.6 pg (ref 26.0–34.0)
MCHC: 33.7 g/dL (ref 30.0–36.0)
MCV: 90.8 fL (ref 80.0–100.0)
Monocytes Absolute: 0.5 10*3/uL (ref 0.1–1.0)
Monocytes Relative: 7 %
Neutro Abs: 3.8 10*3/uL (ref 1.7–7.7)
Neutrophils Relative %: 51 %
Platelets: 156 10*3/uL (ref 150–400)
RBC: 4.58 MIL/uL (ref 4.22–5.81)
RDW: 12.6 % (ref 11.5–15.5)
WBC: 7.5 10*3/uL (ref 4.0–10.5)
nRBC: 0 % (ref 0.0–0.2)

## 2023-03-22 LAB — BASIC METABOLIC PANEL
Anion gap: 8 (ref 5–15)
BUN: 13 mg/dL (ref 6–20)
CO2: 30 mmol/L (ref 22–32)
Calcium: 9.7 mg/dL (ref 8.9–10.3)
Chloride: 102 mmol/L (ref 98–111)
Creatinine, Ser: 1.09 mg/dL (ref 0.61–1.24)
GFR, Estimated: >60 mL/min (ref >60)
Glucose, Bld: 92 mg/dL (ref 70–99)
Potassium: 4 mmol/L (ref 3.5–5.1)
Sodium: 140 mmol/L (ref 135–145)

## 2023-03-22 NOTE — ED Provider Notes (Signed)
[place] EMERGENCY DEPARTMENT AT [facility] Provider Note   CSN: [id number] Arrival date & time: 03/22/23  2113     History  Chief Complaint  Patient presents with   Headache    Jeremy Cervantes is a 42 y.o. male.  Patient reports he has had a headache for the last 3 days.  Patient reports he took Excedrin before coming in tonight and headache has improved patient reports that his wife is a Engineer, civil (consulting) and she took his blood pressure patient reports his blood pressure was elevated at home patient states that his headache is improving however he is concerned about the elevated blood pressure earlier tonight  The history is provided by the patient. No language interpreter was used.  Headache Pain location:  Generalized Chronicity:  New Similar to prior headaches: yes   Relieved by:  Nothing Worsened by:  Nothing Associated symptoms: no congestion, no cough, no facial pain, no fever and no sore throat        Home Medications Prior to Admission medications   Medication Sig Start Date End Date Taking? Authorizing Provider  cefdinir (OMNICEF) 300 MG capsule Take 1 capsule (300 mg total) by mouth 2 (two) times daily. 06/05/21   Wallis Bamberg, PA-C  valACYclovir (VALTREX) 500 MG tablet Take 500 mg by mouth as needed.  05/16/14   [provider]      Allergies    Patient has no known allergies.    Review of Systems   Review of Systems  Constitutional:  Negative for fever.  HENT:  Negative for congestion and sore throat.   Respiratory:  Negative for cough.   Neurological:  Positive for headaches.  All other systems reviewed and are negative.   Physical Exam Updated Vital Signs BP 118/80   Pulse (!) 59   Temp 98.4 F (36.9 C) (Oral)   Resp 18   Ht 5\' 6"  (1.676 m)   Wt 72.6 kg   SpO2 99%   BMI 25.82 kg/m  Physical Exam Vitals and nursing note reviewed.  Constitutional:      Appearance: He is well-developed.  HENT:     Head: Normocephalic.   Cardiovascular:     Rate and Rhythm: Normal rate.  Pulmonary:     Effort: Pulmonary effort is normal.  Abdominal:     General: There is no distension.  Musculoskeletal:        General: Normal range of motion.     Cervical back: Normal range of motion.  Skin:    General: Skin is warm.  Neurological:     General: No focal deficit present.     Mental Status: He is alert and oriented to person, place, and time.  Psychiatric:        Mood and Affect: Mood normal.     ED Results / Procedures / Treatments   Labs (all labs ordered are listed, but only abnormal results are displayed) Labs Reviewed  CBC WITH DIFFERENTIAL/PLATELET  BASIC METABOLIC PANEL    EKG None  Radiology No results found.  Procedures Procedures    Medications Ordered in ED Medications - No data to display  ED Course/ Medical Decision Making/ A&P                             Medical Decision Making Patient complains of having had a headache for the past 3 days.  Patient reports headache is improved after taking Excedrin.  Patient became  concerned because his blood pressure was elevated when his wife took it tonight.  Patient reports he has not had an history of elevated blood pressure in the past  Amount and/or Complexity of Data Reviewed Labs: ordered.    Details: Labs ordered reviewed and interpreted patient has a normal CBC has a normal be met.  Risk Risk Details: Patient's vital signs have been normal while he has been here in the emergency department he has not had any hypotensive readings.  Patient is advised to continue to monitor his blood pressure follow-up with his primary care physician           Final Clinical Impression(s) / ED Diagnoses Final diagnoses:  Nonintractable headache, unspecified chronicity pattern, unspecified headache type    Rx / DC Orders ED Discharge Orders     None     An After Visit Summary was printed and given to the patient.     Elson Areas,  PA-C 03/22/23 2336    Elayne Snare K, DO 03/22/23 2336

## 2023-03-22 NOTE — ED Triage Notes (Signed)
Patient here POV from Home.  Notes mostly constant headache for 3 Days. No N/V. No Known Fever. No Photosensitivity. Excedrin was effective somewhat 1.5 hours ago.   NAD Noted during Triage. A&Ox4. GCS 15. Ambulatory.
